# Patient Record
Sex: Female | Born: 1950 | Race: White | Hispanic: No | Smoking: Never smoker
Health system: Southern US, Community
[De-identification: ages and names within clinical notes are randomized; demographics above are authoritative.]

---

## 2007-09-13 ENCOUNTER — Ambulatory Visit: Payer: Self-pay

## 2008-04-24 ENCOUNTER — Ambulatory Visit: Payer: Self-pay

## 2008-08-24 IMAGING — CR DG HIP COMPLETE 2+V*L*
1 series · 2 of 2 positions shown · non-contrast
Comparison: none

REASON FOR EXAM: pain,  Dr. Gargi Tiger [phone]
COMMENTS:

PROCEDURE:     KDR - KDXR HIP LEFT COMPLETE  - September 13, 2007  [DATE]
RESULT:     No fracture or dislocation is seen. There is narrowing of the
hip joint space compatible with arthritic change. No associated sclerosis or
cystic changes are identified. No lytic or blastic lesions are observed.

[Series 2: view not recorded · 0.17mm/px · 2 of 2 slices shown]
[im 1/2]
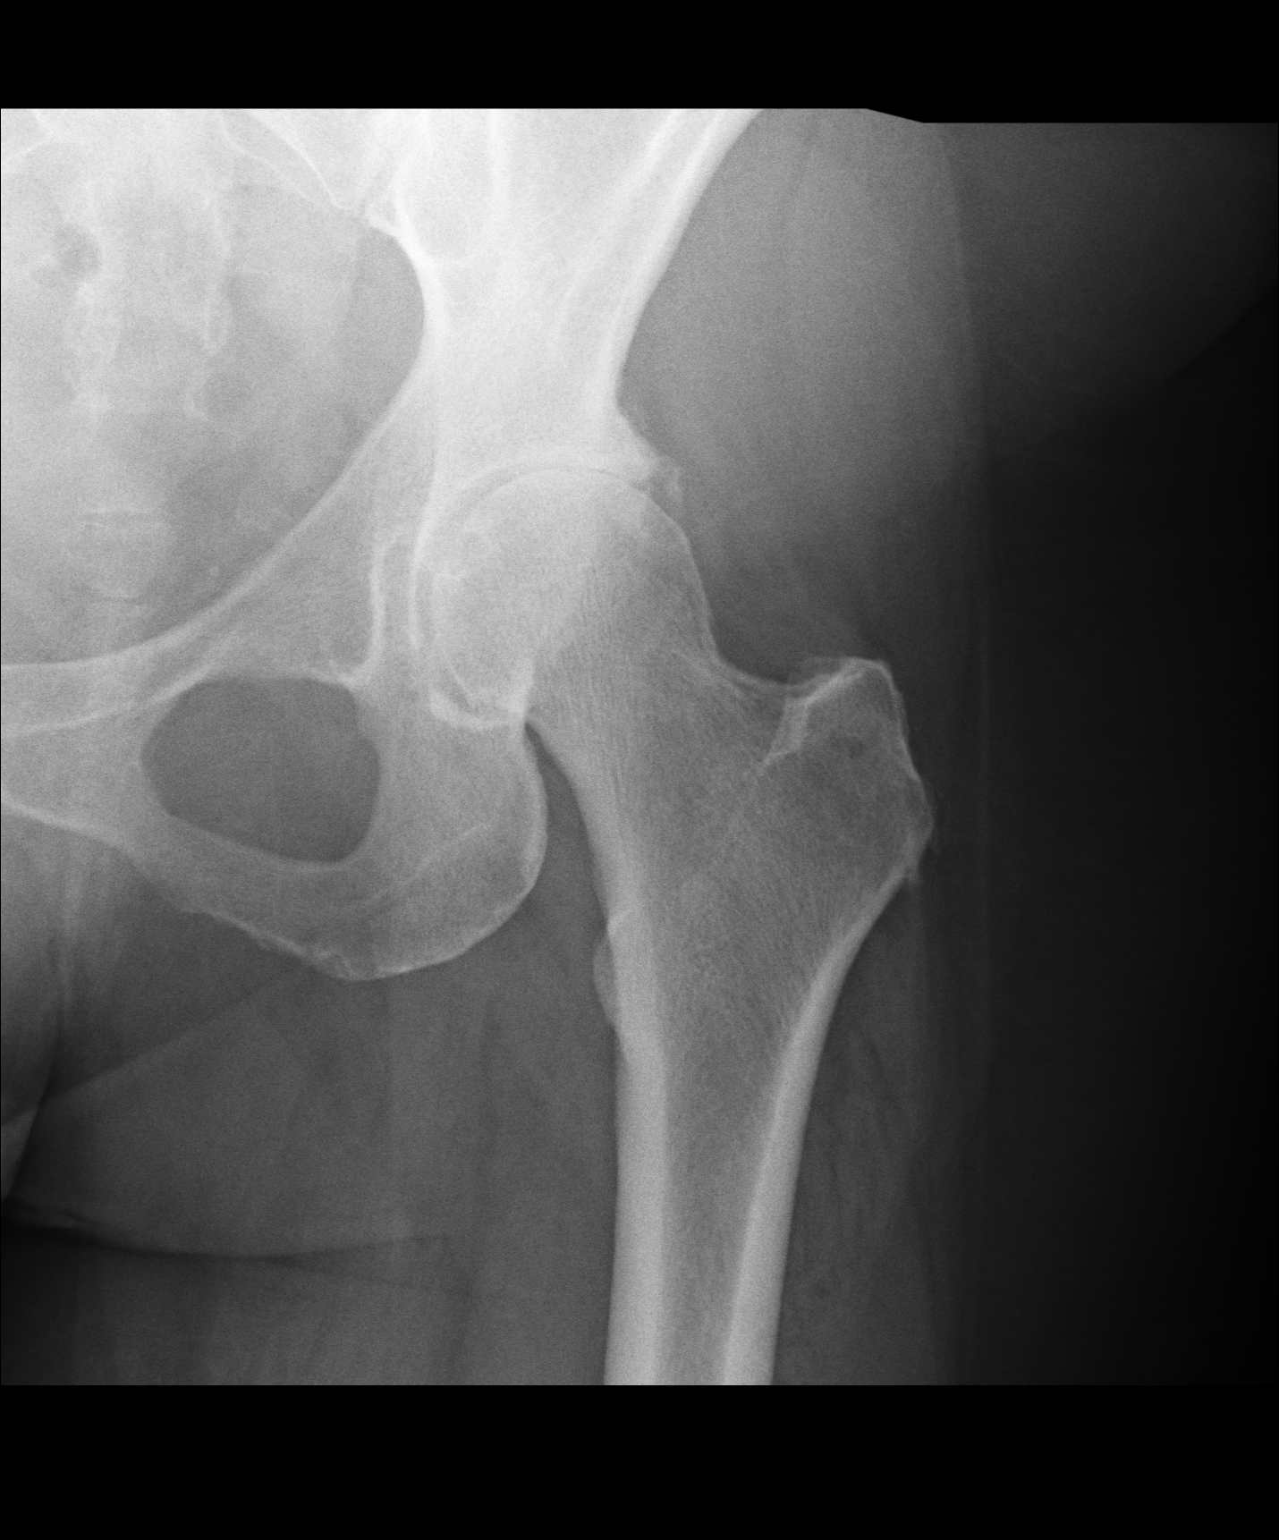
[im 2/2]
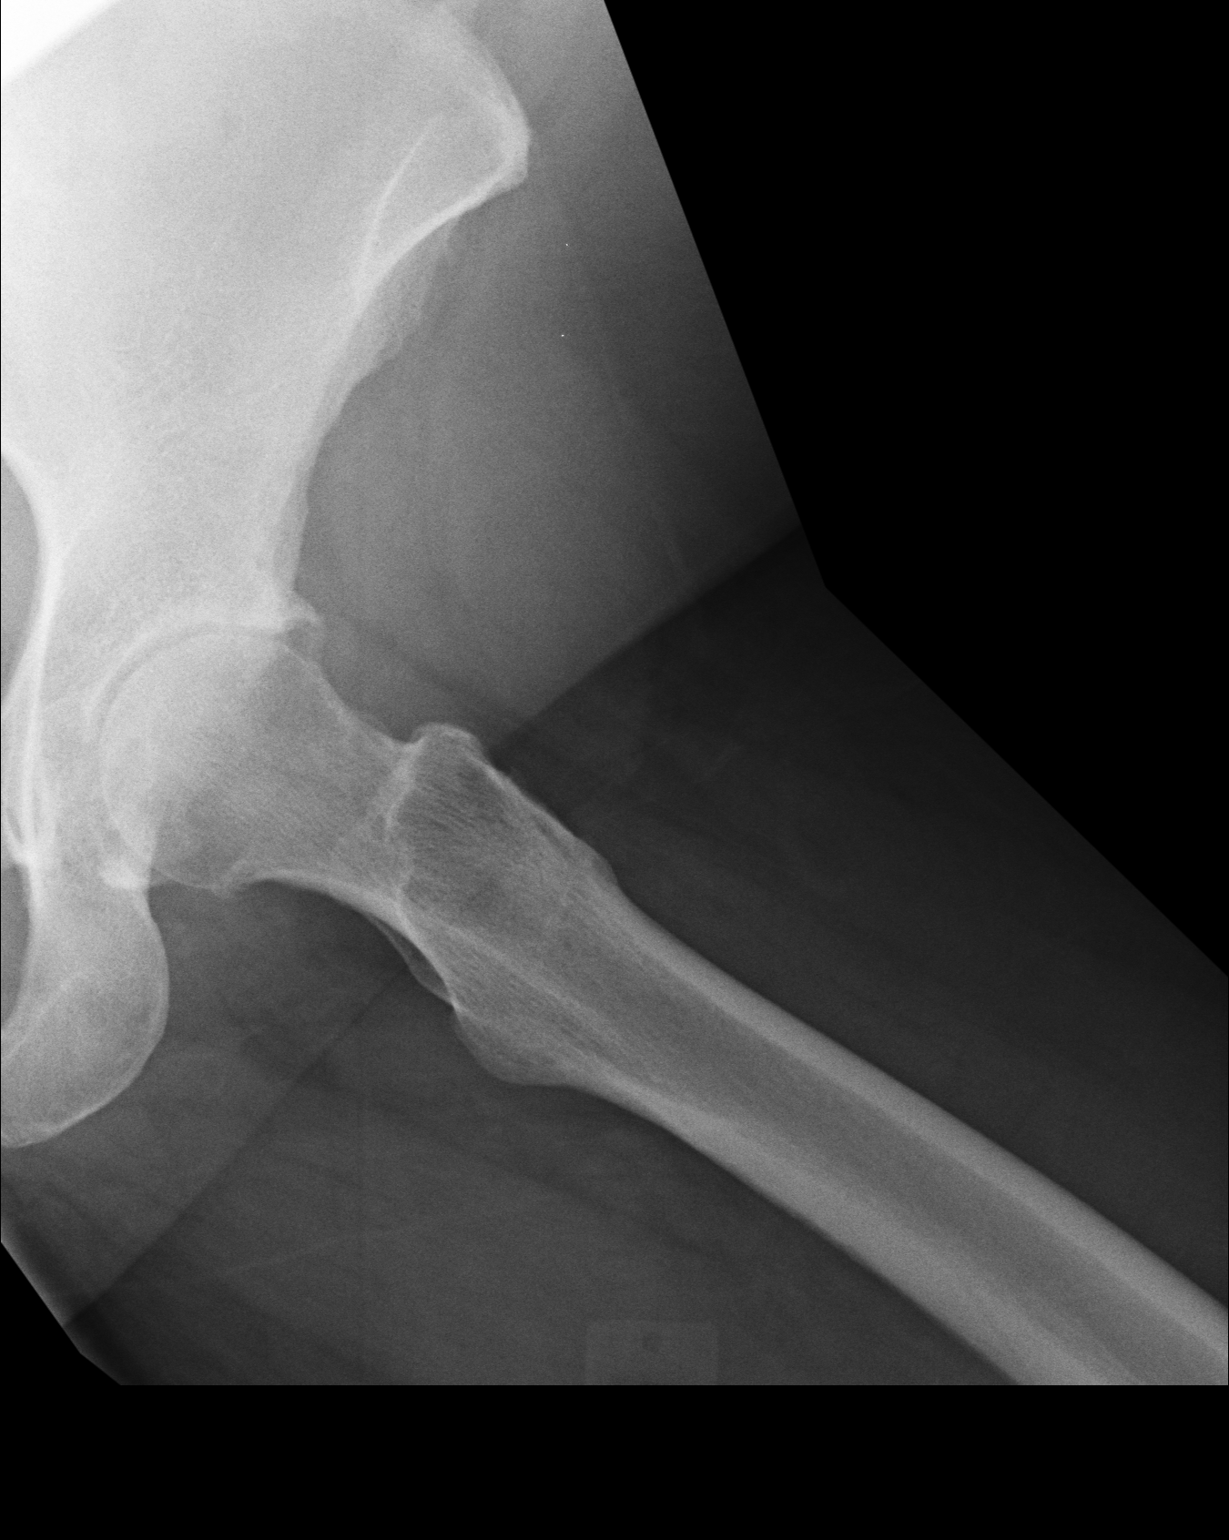

[2 of 2 positions shown; findings below may reference images not displayed]

IMPRESSION: 1. No fracture is seen.
2. There is narrowing of the hip joint space superiorly consistent with
arthritic change but without associated sclerosis or cystic change.

## 2011-01-13 DIAGNOSIS — M1990 Unspecified osteoarthritis, unspecified site: Secondary | ICD-10-CM | POA: Insufficient documentation

## 2012-01-24 DIAGNOSIS — N281 Cyst of kidney, acquired: Secondary | ICD-10-CM | POA: Insufficient documentation

## 2014-03-03 DIAGNOSIS — D1803 Hemangioma of intra-abdominal structures: Secondary | ICD-10-CM | POA: Insufficient documentation

## 2014-03-20 DIAGNOSIS — M67919 Unspecified disorder of synovium and tendon, unspecified shoulder: Secondary | ICD-10-CM | POA: Insufficient documentation

## 2015-01-08 DIAGNOSIS — F419 Anxiety disorder, unspecified: Secondary | ICD-10-CM | POA: Insufficient documentation

## 2015-01-08 DIAGNOSIS — M169 Osteoarthritis of hip, unspecified: Secondary | ICD-10-CM | POA: Insufficient documentation

## 2015-11-23 DIAGNOSIS — E782 Mixed hyperlipidemia: Secondary | ICD-10-CM | POA: Insufficient documentation

## 2020-08-31 ENCOUNTER — Encounter: Payer: Self-pay | Admitting: *Deleted

## 2020-11-07 NOTE — Progress Notes (Signed)
Gastroenterology Consultation  Referring Provider:     Barbaraann Boys, MD Primary Care Physician:  No primary care provider on file. Primary Gastroenterologist:  Dr. Allen Norris     Reason for Consultation:     Diverticulitis        HPI:   Lydia Bryant is a 70 y.o. y/o female referred for consultation & management of Diverticulitis by Dr. No primary care provider on file..  This patient comes in today with a report of diverticulitis.  The patient had a colonoscopy in November 2017 with 1 small hyperplastic polyps removed by Dr. Bonnita Nasuti at [facility]. The patient then followed up with the same doctor in October 2018 and his recommendations were:  PLAN I think it's fine for the patient to continue use of MiraLAX on a regular basis and a probiotic as well. She is going to eat fiber in her diet but is going to avoid hard knots and seeds. There is no recent for surgical consult or any other study at the present time, but if she has episodes of recurrent abdominal pain with fever, we may at some point move up her colonoscopy, which is at this point a 10 year follow-up.  In August 2017 the patient had a CT scan that didn't show sigmoid diverticulitis.  The patient reports that she is doing well now but is here for transfer of care since she is no longer seeing Dr. Bonnita Nasuti.  She reports that she has attacks of diverticulitis once to 2 times a year on average.  Most recently she waited a few weeks to see if it would go away prior to being treated.  She also states that she did not do well on Cipro and Flagyl and did better with Augmentin.  History reviewed. No pertinent past medical history.  History reviewed. No pertinent surgical history.  Prior to Admission medications   Not on File    History reviewed. No pertinent family history.   Social History   Tobacco Use   Smoking status: Never   Smokeless tobacco: Never  Vaping Use   Vaping Use: Never used  Substance Use Topics   Alcohol use: Never    Drug use: Never    Allergies as of 11/08/2020 - Review Complete 11/08/2020  Allergen Reaction Noted   Chlorhexidine  11/08/2020   Codeine  01/08/2015    Review of Systems:    All systems reviewed and negative except where noted in HPI.   Physical Exam:  BP 138/77 (BP Location: Left Arm, Patient Position: Sitting)   Pulse 74   Temp (!) 97 F (36.1 C) (Temporal)   Ht '5\' 3"'$  (1.6 m)   Wt 195 lb 6.4 oz (88.6 kg)   BMI 34.61 kg/m  No LMP recorded. General:   Alert,  Well-developed, well-nourished, pleasant and cooperative in NAD Head:  Normocephalic and atraumatic. Eyes:  Sclera clear, no icterus.   Conjunctiva pink. Ears:  Normal auditory acuity. Neck:  Supple; no masses or thyromegaly. Lungs:  Respirations even and unlabored.  Clear throughout to auscultation.   No wheezes, crackles, or rhonchi. No acute distress. Heart:  Regular rate and rhythm; no murmurs, clicks, rubs, or gallops. Abdomen:  Normal bowel sounds.  No bruits.  Soft, Mild Epigastric tenderness and non-distended without masses, hepatosplenomegaly or hernias noted.  No guarding or rebound tenderness.  Negative Carnett sign.   Rectal:  Deferred.  Pulses:  Normal pulses noted. Extremities:  No clubbing or edema.  No cyanosis. Neurologic:  Alert and  oriented x3;  grossly normal neurologically. Skin:  Intact without significant lesions or rashes.  No jaundice. Lymph Nodes:  No significant cervical adenopathy. Psych:  Alert and cooperative. Normal mood and affect.  Imaging Studies: No results found.  Assessment and Plan:   Lydia Bryant is a 70 y.o. y/o female Who comes in today with a history of recurrent diverticulitis.  The patient's has been doing well recently but wanted to contact me for transfer of care.  The patient denies any abdominal pain at the present time.  She also denies any fevers chills nausea or vomiting.  The patient has been told to continue doing what she is doing since she is feeling well and  to contact me if her diverging lye should flareup sooner than later so she doesn't and upper the complications such as perforation or need for emergent surgery.  The patient has been explained the plan and agrees with it.    Lucilla Lame, MD. Marval Regal    Note: This dictation was prepared with Dragon dictation along with smaller phrase technology. Any transcriptional errors that result from this process are unintentional.

## 2020-11-08 ENCOUNTER — Ambulatory Visit (INDEPENDENT_AMBULATORY_CARE_PROVIDER_SITE_OTHER): Payer: Medicare Other | Admitting: Gastroenterology

## 2020-11-08 ENCOUNTER — Other Ambulatory Visit: Payer: Self-pay

## 2020-11-08 ENCOUNTER — Encounter: Payer: Self-pay | Admitting: Gastroenterology

## 2020-11-08 VITALS — BP 138/77 | HR 74 | Temp 97.0°F | Ht 63.0 in | Wt 195.4 lb

## 2020-11-08 DIAGNOSIS — K5732 Diverticulitis of large intestine without perforation or abscess without bleeding: Secondary | ICD-10-CM | POA: Diagnosis not present

## 2021-08-15 ENCOUNTER — Other Ambulatory Visit: Payer: Self-pay

## 2021-08-15 ENCOUNTER — Telehealth: Payer: Self-pay | Admitting: Gastroenterology

## 2021-08-15 ENCOUNTER — Ambulatory Visit
Admission: RE | Admit: 2021-08-15 | Discharge: 2021-08-15 | Disposition: A | Discharge status: Home / Self Care | Payer: Medicare Other | Source: Ambulatory Visit | Attending: Gastroenterology | Admitting: Gastroenterology

## 2021-08-15 DIAGNOSIS — R1032 Left lower quadrant pain: Secondary | ICD-10-CM | POA: Diagnosis present

## 2021-08-15 LAB — POCT I-STAT CREATININE: Creatinine, Ser: 1 mg/dL (ref 0.44–1.00)

## 2021-08-15 MED ORDER — IOHEXOL 300 MG/ML  SOLN
100.0000 mL | Freq: Once | INTRAMUSCULAR | Status: AC | PRN
  Administered 2021-08-15: 100 mL via INTRAVENOUS

## 2021-08-15 MED ORDER — AMOXICILLIN-POT CLAVULANATE 875-125 MG PO TABS: 1.0000 | ORAL_TABLET | Freq: Two times a day (BID) | ORAL | 0 refills | Status: DC

## 2021-08-15 NOTE — Telephone Encounter (Signed)
Patient states that Dr Allen Norris said the next time she has a flare up for the diverticulitis to give [place] a call and get a CT scheduled. Patient requesting a call back.  ?

## 2021-08-15 NOTE — Addendum Note (Signed)
Addended by: Lurlean Nanny on: 08/15/2021 12:38 PM ? ? Modules accepted: Orders ? ?

## 2021-08-15 NOTE — Telephone Encounter (Signed)
I did not see anything specifically saying to order a CT scan, I just saw to contact you if she has a flare up... Pt last seen 11/2020... Please advise  ?

## 2021-08-15 NOTE — Telephone Encounter (Signed)
CT Scan ordered STAT , was advised to have pt go to [facility] to be worked in today... Pt is aware and expressed understanding  ?

## 2021-08-26 ENCOUNTER — Other Ambulatory Visit: Payer: Self-pay

## 2021-08-26 MED ORDER — AMOXICILLIN-POT CLAVULANATE 875-125 MG PO TABS: 1.0000 | ORAL_TABLET | Freq: Two times a day (BID) | ORAL | 0 refills | Status: DC

## 2021-09-13 ENCOUNTER — Telehealth: Payer: Self-pay

## 2021-09-13 NOTE — Telephone Encounter (Signed)
Pt lmovm req for call back due to her ongoing diverticulitis Sx.Marland KitchenMarland Kitchen

## 2021-09-16 NOTE — Telephone Encounter (Signed)
Pt reports she had relief only while taking Ax for diverticulitis, but Sx returned within 2 days after completing... Pt currently reports she has adjusted her diet and has been doing better and states she will call back if Sx return... Pt would like to know if there is anything additional she can do in the meantime to help keep under better control.Marland KitchenMarland Kitchen

## 2021-09-16 NOTE — Addendum Note (Signed)
Addended by: Lurlean Nanny on: 09/16/2021 10:26 AM   Modules accepted: Orders

## 2021-09-22 NOTE — Telephone Encounter (Signed)
Pt expressed understanding and reports this is something she already does... Pt states she will be trying to travel in the future and in the past she would have a Rx to take with her "just in case"... is this something you would be agreeable to provide?

## 2021-09-29 ENCOUNTER — Other Ambulatory Visit: Payer: Self-pay | Admitting: Pediatrics

## 2021-09-29 DIAGNOSIS — Z780 Asymptomatic menopausal state: Secondary | ICD-10-CM

## 2021-09-29 DIAGNOSIS — E782 Mixed hyperlipidemia: Secondary | ICD-10-CM

## 2021-09-29 DIAGNOSIS — Z1231 Encounter for screening mammogram for malignant neoplasm of breast: Secondary | ICD-10-CM

## 2022-07-27 IMAGING — CT CT ABD-PELV W/ CM
2 of 5 series · 16 of 46 positions shown, 18 images · IV contrast (APPLIED)
Comparison: None Available.

CLINICAL DATA: Left lower quadrant abdominal pain history of
diverticulitis.

EXAM:
CT ABDOMEN AND PELVIS WITH CONTRAST
TECHNIQUE: Multidetector CT imaging of the abdomen and pelvis was performed
using the standard protocol following bolus administration of
intravenous contrast.

[Series 2: axial st · axial · 0.91mm/px · z∈[-877,-437]mm · 13 of 100 slices shown, 15 images]
[im 6/100  soft-tissue]
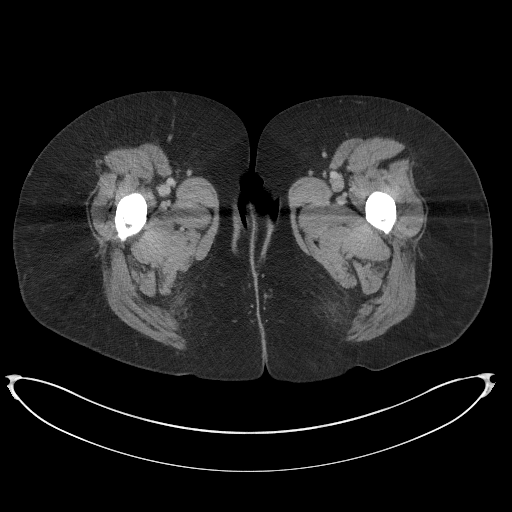
[im 6/100  bone]
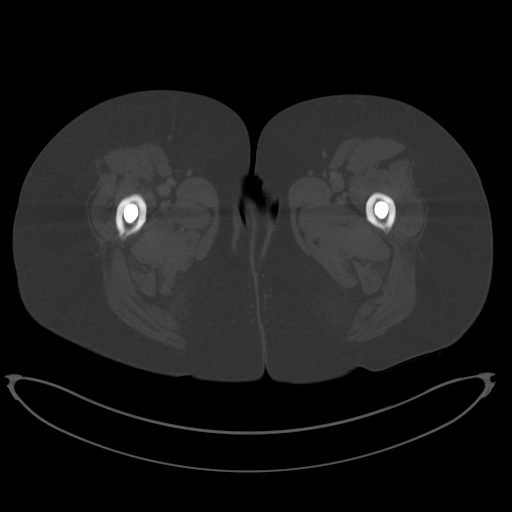
[im 12/100  soft-tissue]
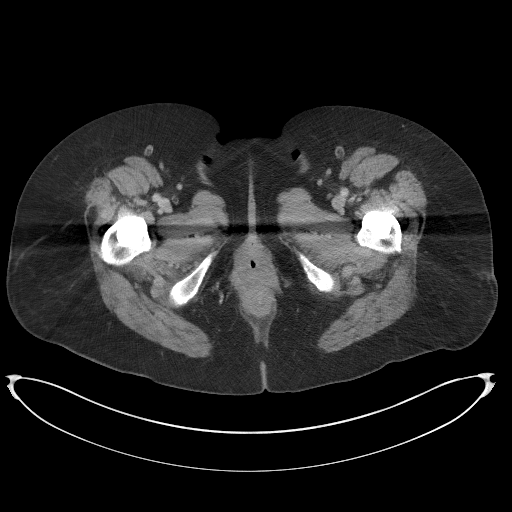
[im 23/100  soft-tissue]
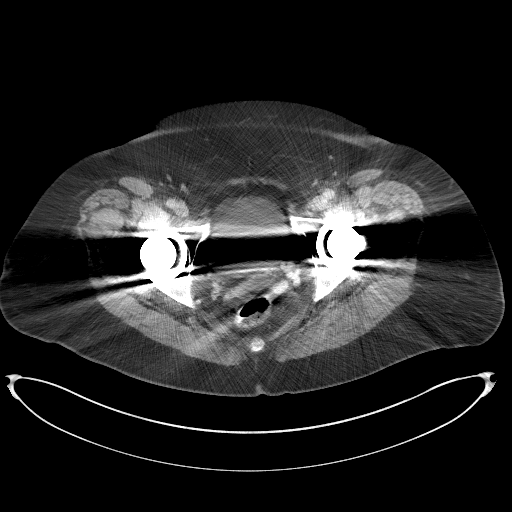
[im 28/100  soft-tissue]
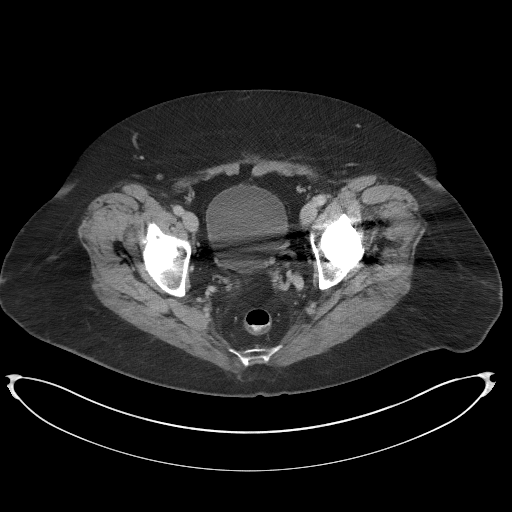
[im 34/100  soft-tissue]
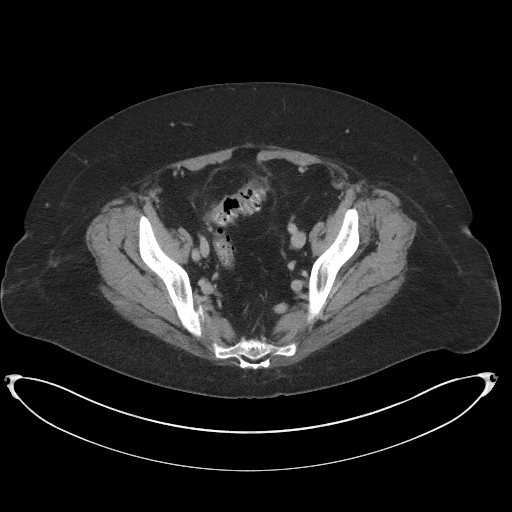
[im 45/100  soft-tissue]
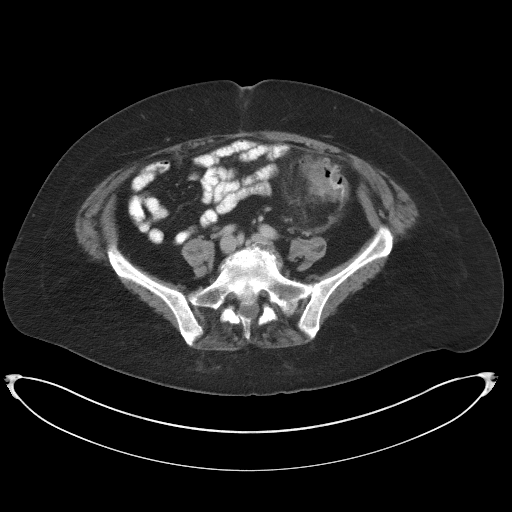
[im 50/100  soft-tissue]
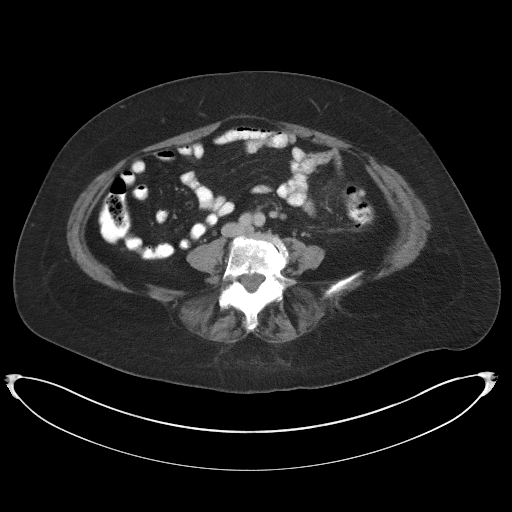
[im 56/100  soft-tissue]
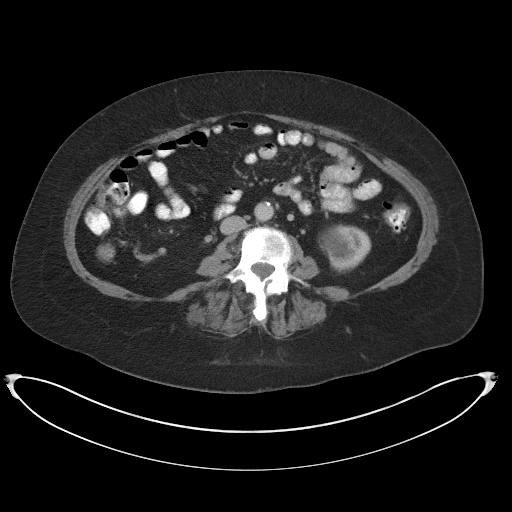
[im 67/100  soft-tissue]
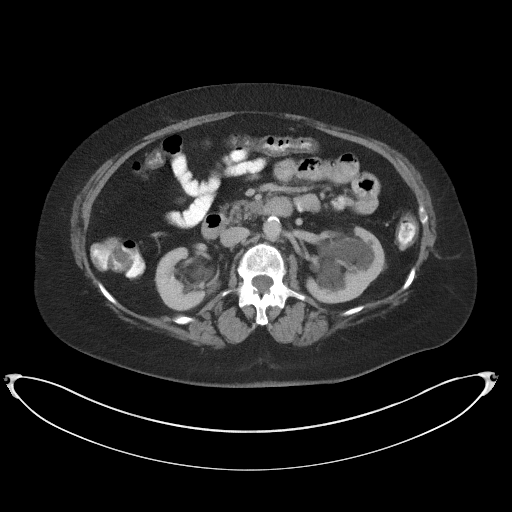
[im 67/100  bone]
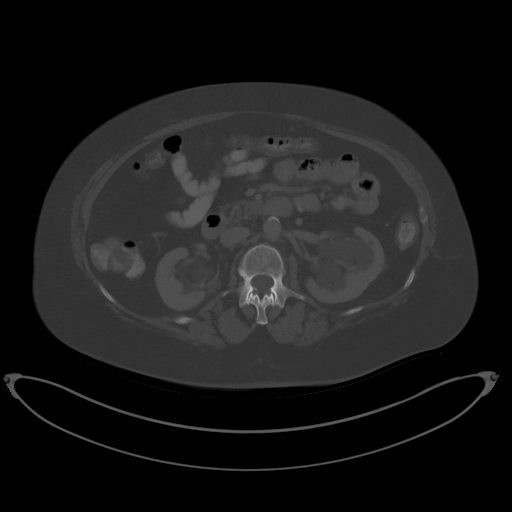
[im 72/100  soft-tissue]
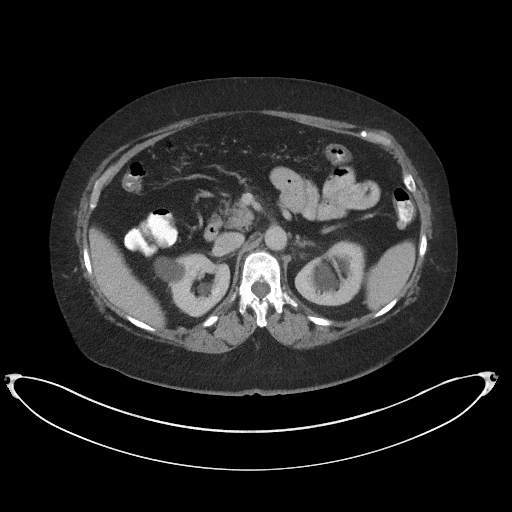
[im 78/100  soft-tissue]
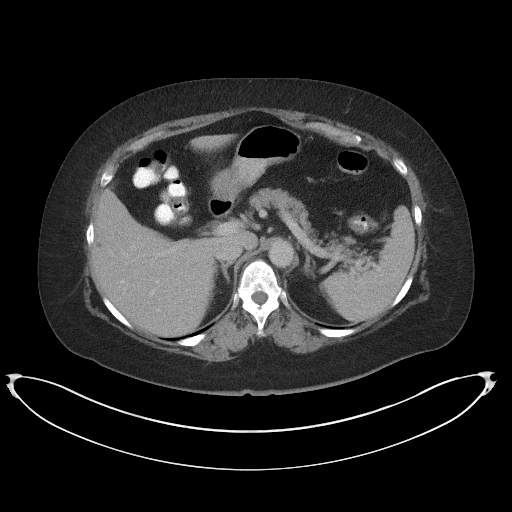
[im 89/100  soft-tissue]
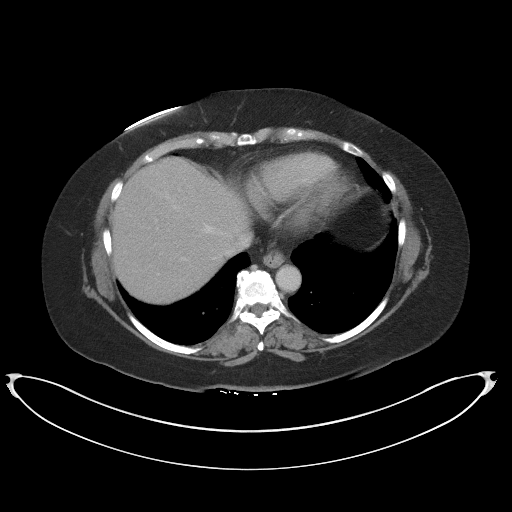
[im 94/100  soft-tissue]
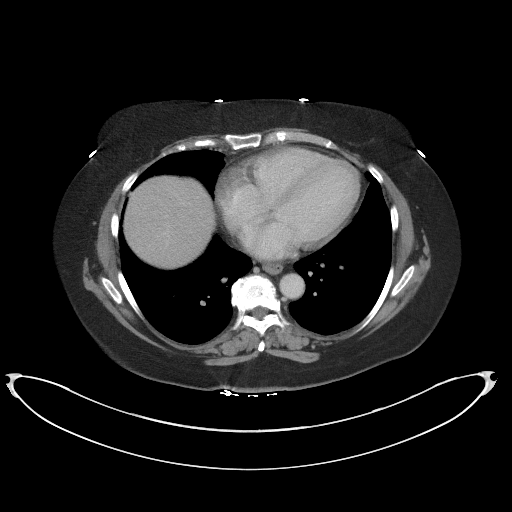

[Series 5: coronal st · coronal · 0.90mm/px · 3 of 100 slices shown]
[im 34/100  soft-tissue]
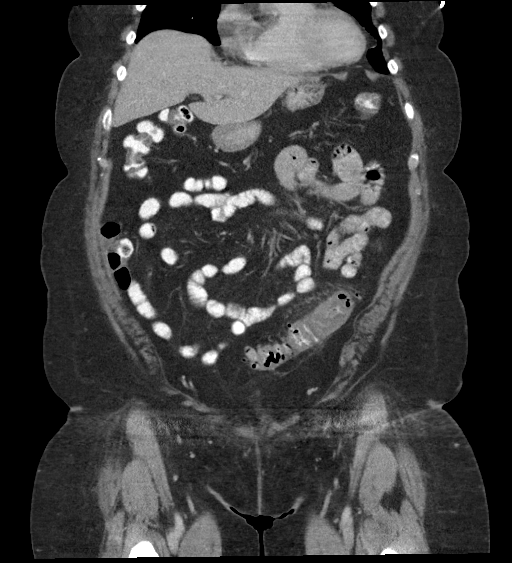
[im 45/100  soft-tissue]
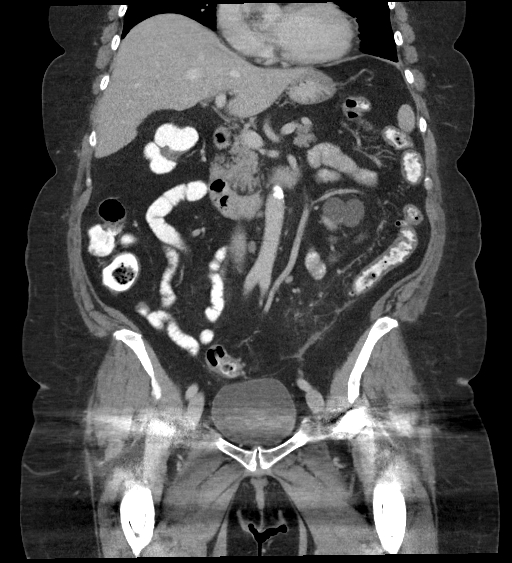
[im 56/100  soft-tissue]
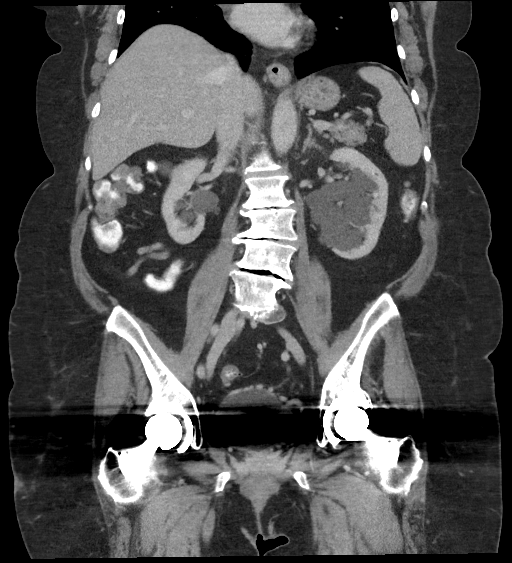

[16 of 46 positions shown; findings below may reference images not displayed]

RADIATION DOSE REDUCTION: This exam was performed according to the
departmental dose-optimization program which includes automated
exposure control, adjustment of the mA and/or kV according to
patient size and/or use of iterative reconstruction technique.

CONTRAST:  100mL OMNIPAQUE IOHEXOL 300 MG/ML  SOLN
FINDINGS: Lower chest: No acute abnormality.

Hepatobiliary: No suspicious hepatic lesion. Gallbladder surgically
absent. No biliary ductal dilation.

Pancreas: No pancreatic ductal dilation or evidence of acute
inflammation.

Spleen: No splenomegaly or focal splenic lesion.

Adrenals/Urinary Tract: Bilateral adrenal glands appear normal. No
hydronephrosis. Bilateral renal sinus cysts and cortical renal cysts
measuring up to 3.2 cm on the right, which in the absence of
clinically indicated signs/symptoms require no independent
follow-up. Urinary bladder is unremarkable for degree of distension
within the limitation of streak artifact from hip arthroplasties.

Stomach/Bowel: Radiopaque enteric contrast material traverses the
rectum. Stomach is unremarkable for degree of distension. No
pathologic dilation of small or large bowel. The appendix and
terminal ileum appear normal.

Left-sided colonic diverticulosis with diverticulitis of the sigmoid
colon. No extraluminal contrast material.

Vascular/Lymphatic: Aortic and branch vessel atherosclerosis without
abdominal aortic aneurysm. No pathologically enlarged abdominal or
pelvic lymph nodes.

Reproductive: Status post hysterectomy. No adnexal masses.

Other: No walled off fluid collections.  No pneumoperitoneum.

Musculoskeletal: Lumbar predominant multilevel degenerative changes
spine with degenerative grade 1 L4 on L5 anterolisthesis.
IMPRESSION: 1. Acute uncomplicated sigmoid diverticulitis.
2.  Aortic Atherosclerosis ([id number].V).
